# Patient Record
Sex: Female | Born: 1960 | Race: White | Hispanic: No | Marital: Married | Smoking: Never smoker
Health system: Southern US, Community
[De-identification: ages and names within clinical notes are randomized; demographics above are authoritative.]

---

## 1998-03-14 ENCOUNTER — Inpatient Hospital Stay (HOSPITAL_COMMUNITY): Admission: AD | Admit: 1998-03-14 | Discharge: 1998-03-16 | Payer: Self-pay | Admitting: Obstetrics and Gynecology

## 1998-03-16 ENCOUNTER — Encounter (HOSPITAL_COMMUNITY): Admission: RE | Admit: 1998-03-16 | Discharge: 1998-06-14 | Payer: Self-pay | Admitting: Obstetrics and Gynecology

## 1998-06-25 ENCOUNTER — Encounter (HOSPITAL_COMMUNITY): Admission: RE | Admit: 1998-06-25 | Discharge: 1998-09-04 | Payer: Self-pay | Admitting: *Deleted

## 2000-07-19 ENCOUNTER — Other Ambulatory Visit: Admission: RE | Admit: 2000-07-19 | Discharge: 2000-07-19 | Payer: Self-pay | Admitting: Obstetrics and Gynecology

## 2001-08-13 ENCOUNTER — Other Ambulatory Visit: Admission: RE | Admit: 2001-08-13 | Discharge: 2001-08-13 | Payer: Self-pay | Admitting: Obstetrics and Gynecology

## 2002-10-21 ENCOUNTER — Other Ambulatory Visit: Admission: RE | Admit: 2002-10-21 | Discharge: 2002-10-21 | Payer: Self-pay | Admitting: Obstetrics and Gynecology

## 2004-01-06 ENCOUNTER — Other Ambulatory Visit: Admission: RE | Admit: 2004-01-06 | Discharge: 2004-01-06 | Payer: Self-pay | Admitting: Obstetrics and Gynecology

## 2005-02-09 ENCOUNTER — Other Ambulatory Visit: Admission: RE | Admit: 2005-02-09 | Discharge: 2005-02-09 | Payer: Self-pay | Admitting: Obstetrics and Gynecology

## 2005-03-30 ENCOUNTER — Encounter: Admission: RE | Admit: 2005-03-30 | Discharge: 2005-03-30 | Payer: Self-pay | Admitting: Obstetrics and Gynecology

## 2006-03-31 ENCOUNTER — Encounter: Admission: RE | Admit: 2006-03-31 | Discharge: 2006-03-31 | Payer: Self-pay | Admitting: Obstetrics and Gynecology

## 2007-05-10 ENCOUNTER — Encounter: Admission: RE | Admit: 2007-05-10 | Discharge: 2007-05-10 | Payer: Self-pay | Admitting: Obstetrics & Gynecology

## 2007-10-10 ENCOUNTER — Encounter: Admission: RE | Admit: 2007-10-10 | Discharge: 2007-10-10 | Payer: Self-pay | Admitting: Orthopedic Surgery

## 2009-01-06 ENCOUNTER — Ambulatory Visit: Payer: Self-pay | Admitting: Pulmonary Disease

## 2009-01-06 DIAGNOSIS — R5383 Other fatigue: Secondary | ICD-10-CM

## 2009-01-06 DIAGNOSIS — R5381 Other malaise: Secondary | ICD-10-CM

## 2009-01-12 ENCOUNTER — Telehealth (INDEPENDENT_AMBULATORY_CARE_PROVIDER_SITE_OTHER): Payer: Self-pay | Admitting: *Deleted

## 2009-09-03 ENCOUNTER — Encounter: Admission: RE | Admit: 2009-09-03 | Discharge: 2009-09-03 | Payer: Self-pay | Admitting: Obstetrics and Gynecology

## 2010-07-23 IMAGING — US UNKNOWN US STUDY
1 series · 6 of 6 positions shown · non-contrast
Comparison: 07/01/2008, 05/10/2007 and 03/31/2006

CLINICAL DATA: Patient presents for additional views of the left
breast as a follow up to recent screening mammogram from 08/31/2009
demonstrating possible mass in the upper outer left breast.

DIGITAL DIAGNOSTIC  LEFT  MAMMOGRAM   AND LEFT BREAST ULTRASOUND:

[Series 1: unknown us study · 6 of 6 slices shown]
[im 1/6]
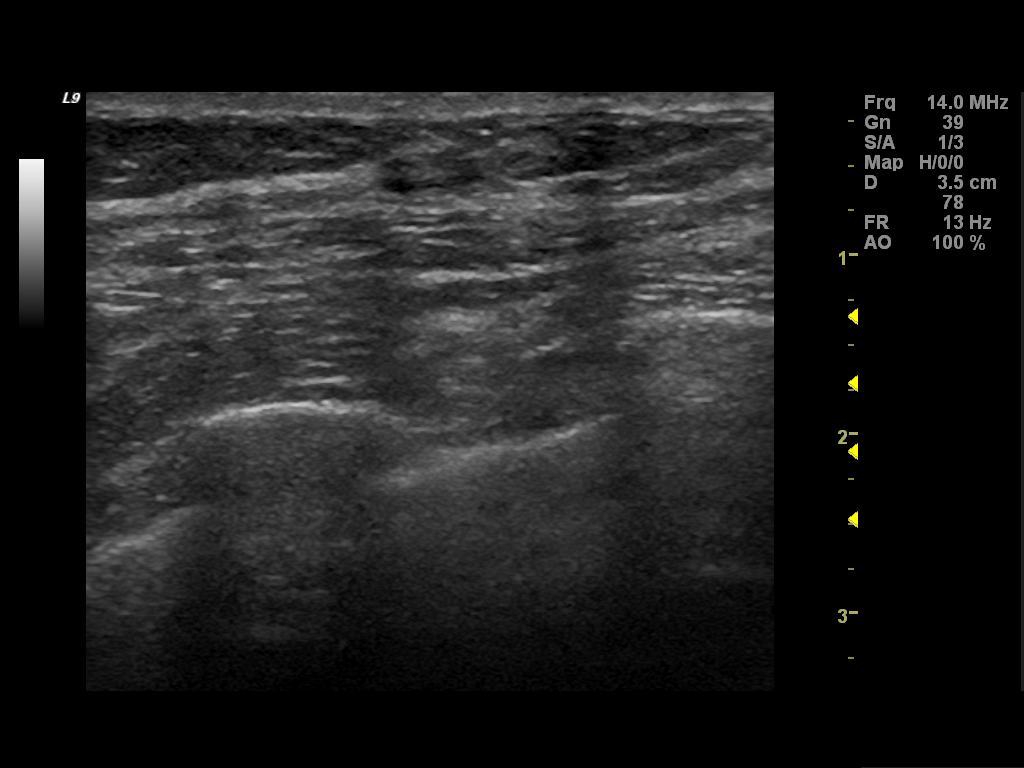
[im 2/6]
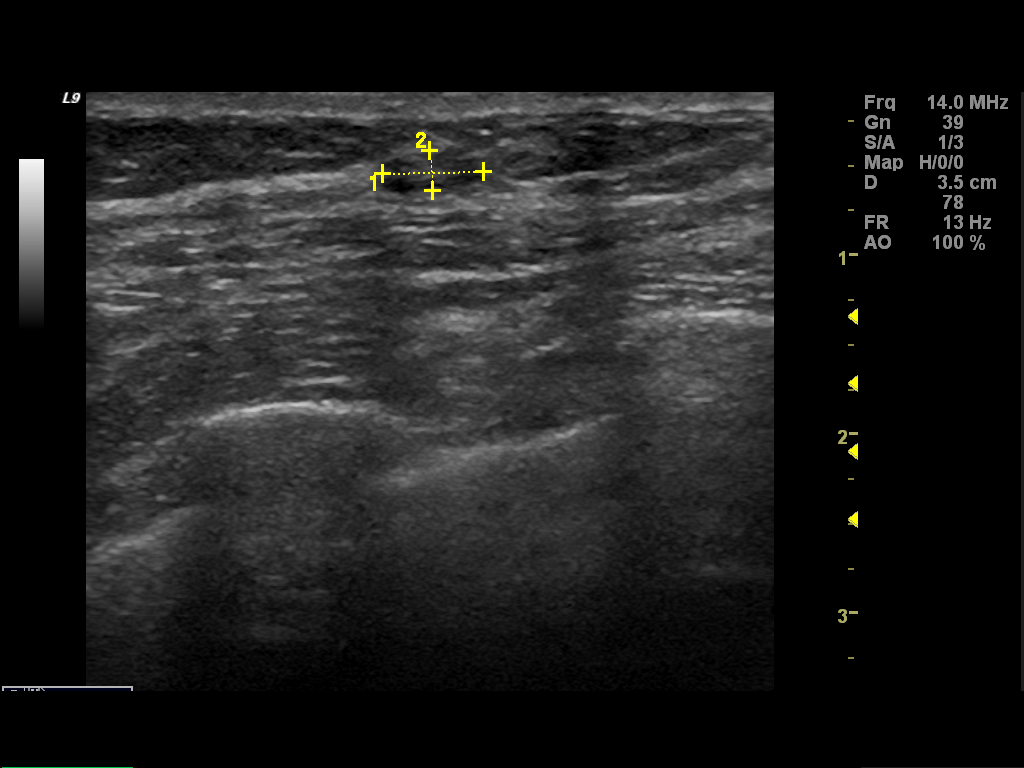
[im 3/6]
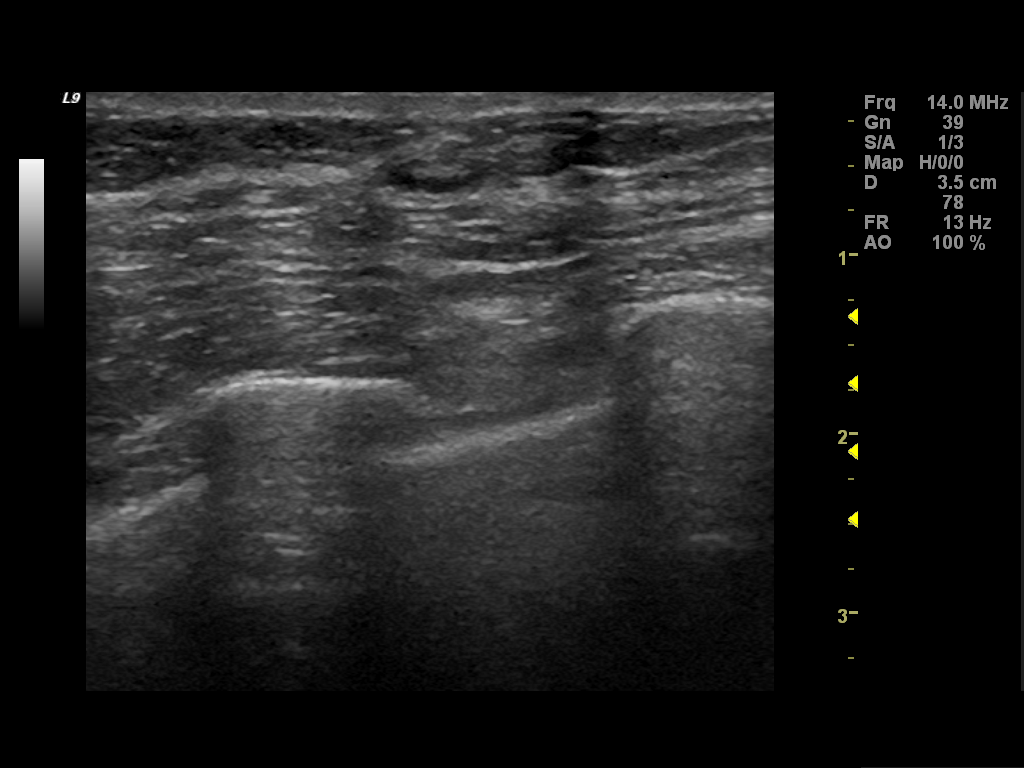
[im 4/6]
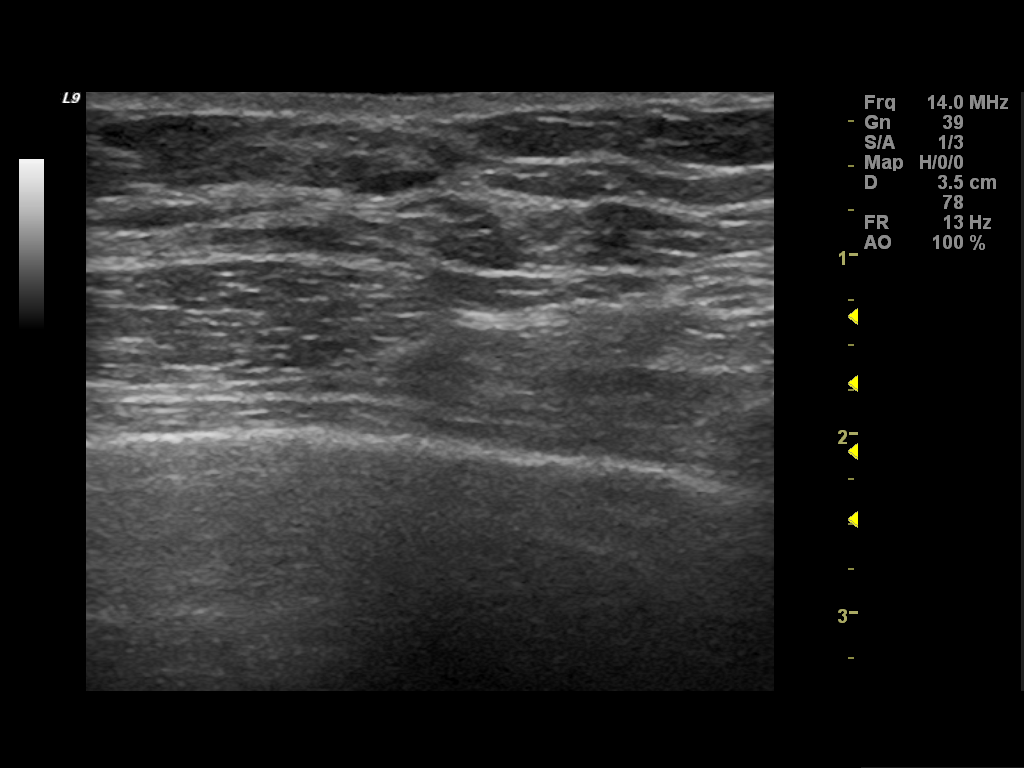
[im 5/6]
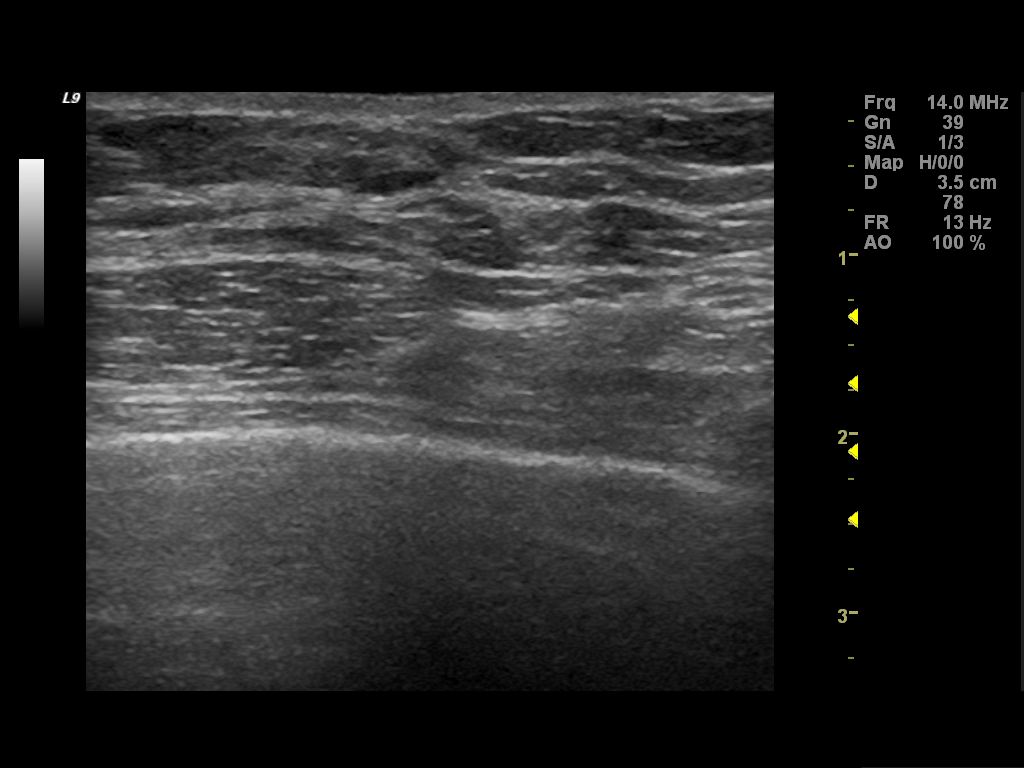
[im 6/6]
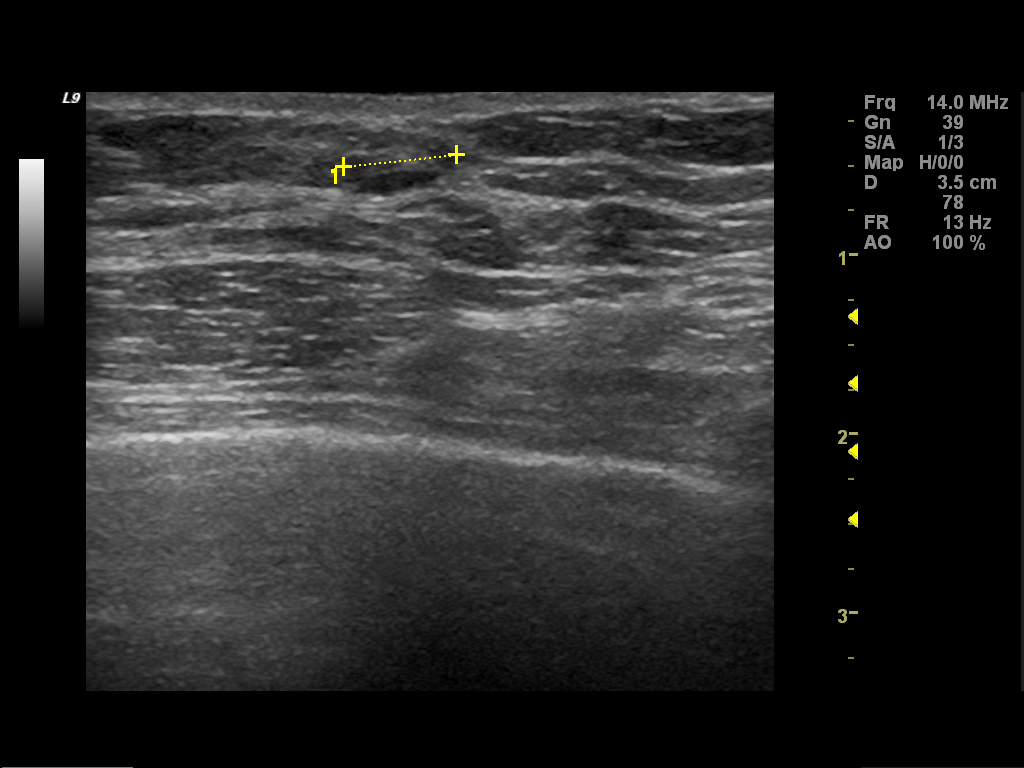

[6 of 6 positions shown; findings below may reference images not displayed]

FINDINGS: Spot compression images demonstrate no definite focal
abnormality in the upper outer left breast.  Dense fibroglandular
tissue is present in this area and appears slightly different in
morphology compared to the prior exams.

Ultrasound is performed, showing a 6 mm superficial lymph node in
the upper outer left breast in the 2 o'clock position 12 cm from
the nipple.  This appears too far from the nipple to correspond to
the mammographic density.  No other focal abnormality seen in the
upper outer left breast.
IMPRESSION: No definite focal abnormality in the upper outer left breast to
correspond to the density on the screening exam.  This likely
represents overlapping fibroglandular tissue.

Recommendations:  Due to the appearance on the screening exam,
would recommend a 6-month follow-up left breast mammogram to
document stability of this probable benign density.

BI-RADS CATEGORY 3:  Probably benign finding(s) - short interval
follow-up suggested.

## 2010-10-17 ENCOUNTER — Encounter: Payer: Self-pay | Admitting: Obstetrics and Gynecology

## 2020-06-26 ENCOUNTER — Ambulatory Visit (HOSPITAL_COMMUNITY)
Admission: EM | Admit: 2020-06-26 | Discharge: 2020-06-26 | Disposition: A | Discharge status: Home / Self Care | Payer: 59 | Attending: Family | Admitting: Family

## 2020-06-26 ENCOUNTER — Other Ambulatory Visit: Payer: Self-pay

## 2020-06-26 DIAGNOSIS — F331 Major depressive disorder, recurrent, moderate: Secondary | ICD-10-CM | POA: Diagnosis not present

## 2020-06-26 NOTE — ED Provider Notes (Signed)
Behavioral Health Urgent Care Medical Screening Exam  Patient Name: Linda Willis MRN: [id number] Date of Evaluation: 06/26/20 Chief Complaint:   Diagnosis:  Final diagnoses:  Moderate episode of recurrent major depressive disorder (HCC)    History of Present illness: Linda Willis is a 59 y.o. female.  Patient has voluntarily to [facility].  Patient states "I have been feeling low for several months."  Patient reports she was prescribed sertraline for approximately 10 years prior to changing her antidepressant medication to [facility] in January 2021.  Patient reports she is prescribed Vraylar by primary care provider.  Patient reports most recent stressors include death of her father in 2019/10/16 and her young adult son's recent diagnosis of anxiety disorder. Patient reports she presents today as she would like to initiate outpatient psychiatric follow-up.  Patient assessed by nurse practitioner.  Patient alert and oriented, answers appropriately.  Patient pleasant cooperative with assessment.  Patient denies suicidal ideations.  Patient denies any history of self-harm behaviors, denies history of suicide attempts.  Patient also denies homicidal ideations.  Patient denies both auditory and visual hallucinations.  There is no evidence of delusional thought content and patient does not appear to be responding to internal stimuli.  Patient resides in [place] with her husband.  Patient denies access to weapons.  Patient is employed part-time in [employer].  Patient denies alcohol and substance use.  Patient enjoys long distance bicycle riding but recently has been unable to ride her bicycle as she has experienced low energy since January 2021.  Patient reports increased fatigue.  Patient offered support encouragement.  Patient gives verbal consent to speak with her husband, Bonny Vanleeuwen who is in the lobby. Spoke with patient's husband  Jenny Reichmann who denies any concerns for patient safety.  Patient's husband also denies any access to weapons to his knowledge.  Patient's husband reports patient has experienced depressed mood since the death of her father.  Patient's husband supports plan to follow-up with outpatient psychiatric provider.    Psychiatric Specialty Exam  Presentation  General Appearance:Appropriate for Environment;Casual  Eye Contact:Good  Speech:Clear and Coherent;Normal Rate  Speech Volume:Normal  Handedness:Right   Mood and Affect  Mood:Depressed  Affect:Depressed;Tearful   Thought Process  Thought Processes:Coherent;Goal Directed  Descriptions of Associations:Intact  Orientation:Full (Time, Place and Person)  Thought Content:Logical;WDL  Hallucinations:None  Ideas of Reference:None  Suicidal Thoughts:No  Homicidal Thoughts:No   Sensorium  Memory:Immediate Good;Recent Good;Remote Good  Judgment:Good  Insight:Good   Executive Functions  Concentration:Good  Attention Span:Good  Recall:Good  Fund of Knowledge:Good  Language:Good   Psychomotor Activity  Psychomotor Activity:Normal   Assets  Assets:Communication Skills;Desire for Improvement;Financial Resources/Insurance;Housing;Intimacy;Leisure Time;Physical Health;Resilience;Social Support   Sleep  Sleep:Fair  Number of hours: No data recorded  Physical Exam: Physical Exam Vitals and nursing note reviewed.  Constitutional:      Appearance: She is well-developed.  HENT:     Head: Normocephalic.  Cardiovascular:     Rate and Rhythm: Normal rate.  Pulmonary:     Effort: Pulmonary effort is normal.  Neurological:     Mental Status: She is alert and oriented to person, place, and time.  Psychiatric:        Attention and Perception: Attention and perception normal.        Mood and Affect: Mood is depressed. Affect is tearful.        Speech: Speech normal.        Behavior: Behavior normal. Behavior is  cooperative.  Thought Content: Thought content normal.        Cognition and Memory: Cognition and memory normal.        Judgment: Judgment normal.    Review of Systems  Constitutional: Negative.   HENT: Negative.   Eyes: Negative.   Respiratory: Negative.   Cardiovascular: Negative.   Gastrointestinal: Negative.   Genitourinary: Negative.   Musculoskeletal: Negative.   Skin: Negative.   Neurological: Negative.   Endo/Heme/Allergies: Negative.   Psychiatric/Behavioral: Positive for depression.   Blood pressure 118/85, pulse (!) 59, temperature 97.7 F (36.5 C), temperature source Temporal, resp. rate 18, height 5\' 4"  (1.626 m), weight 130 lb (59 kg), SpO2 100 %. Body mass index is 22.31 kg/m.  Musculoskeletal: Strength & Muscle Tone: within normal limits Gait & Station: normal Patient leans: N/A   [place] MSE Discharge Disposition for Follow up and Recommendations: Based on my evaluation the patient does not appear to have an emergency medical condition and can be discharged with resources and follow up care in outpatient services for Medication Management and Individual Therapy  Follow-up with outpatient psychiatric provider.  Patient reviewed with Dr. Hampton Abbot.    Emmaline Kluver, FNP 06/26/2020, 1:04 PM

## 2020-06-26 NOTE — ED Notes (Signed)
Patient does not have a locker.

## 2020-06-26 NOTE — Progress Notes (Signed)
Linda Willis received her AVS, questions answered and she was escorted to the lobby where her husband was waiting. She did not have any personal belongings to retrieve.

## 2020-06-26 NOTE — BH Assessment (Signed)
Comprehensive Clinical Assessment (CCA) Screening, Triage and Referral Note  06/26/2020 Linda Willis [id number]  Patient is a 59 y.o. female with a history of depression who presents to [facility] for assessment. Patient reports she has been prescribed zoloft for the past 10 years and was changed to vraylar 10 months ago.  Her PCP made the change, as vraylar may be a better option for patients with fibromyalgia.   Patient is concerned vraylar isn't effective at this point. She also reports multiple stressors to include losing her father in December of 2020 and supporting her son with mental health concerns this summer.  She states her depression has worsened over the past few months.  She has had poor appetite, tearfulness, lack of motivation and lack of interest in activities she has enjoyed for years, specifically bike riding with friends. She is now struggling to function and is hoping to engage in treatment soon to prevent further decline. Patient denies SI, HI and AVH.  She is agreeable with the recommendation for outpatient treatment.    Disposition: Per Letitia Libra, NP patient does not meet inpatient criteria. The recommendation is that patient follow up with outpatient psychiatry and therapy.  Referral information is included in the AVS to be provided to pt upon discharge.   Visit Diagnosis:    ICD-10-CM   1. Moderate episode of recurrent major depressive disorder ([place])  F33.1     Patient Reported Information How did you hear about [place]? Family/Friend   Referral name: Patient presents with her husand for assessment.   Referral phone number: No data recorded Whom do you see for routine medical problems? Other (Comment) (Dr. Darron Doom - Family Medicine)   Practice/Facility Name: No data recorded  Practice/Facility Phone Number: No data recorded  Name of Contact: No data recorded  Contact Number: No data recorded  Contact Fax Number: No data recorded  Prescriber  Name: No data recorded  Prescriber Address (if known): No data recorded What Is the Reason for Your Visit/Call Today? No data recorded How Long Has This Been Causing You Problems? 1-6 months  Have You Recently Been in Any Inpatient Treatment (Hospital/Detox/Crisis Center/28-Day Program)? No   Name/Location of Program/Hospital:No data recorded  How Long Were You There? No data recorded  When Were You Discharged? No data recorded Have You Ever Received Services From [facility] Before? No   Who Do You See at [facility]? No data recorded Have You Recently Had Any Thoughts About Hurting Yourself? No   Are You Planning to Commit Suicide/Harm Yourself At This time?  No  Have you Recently Had Thoughts About [place]? No   Explanation: No data recorded Have You Used Any Alcohol or Drugs in the Past 24 Hours? No   How Long Ago Did You Use Drugs or Alcohol?  No data recorded  What Did You Use and How Much? No data recorded What Do You Feel Would Help You the Most Today? Medication;Therapy  Do You Currently Have a Therapist/Psychiatrist? No   Name of Therapist/Psychiatrist: No data recorded  Have You Been Recently Discharged From Any Office Practice or Programs? No   Explanation of Discharge From Practice/Program:  No data recorded    CCA Screening Triage Referral Assessment Type of Contact: Face-to-Face   Is this Initial or Reassessment? No data recorded  Date Telepsych consult ordered in [facility]:  No data recorded  Time Telepsych consult ordered in [facility]:  No data recorded Patient Reported Information Reviewed? Yes  Patient Left Without Being Seen? No data recorded  Reason for Not Completing Assessment: No data recorded Collateral Involvement: Patient's husband provided collateral to NP Hall Busing  Does Patient Have a [place]? No data recorded  Name and Contact of Legal Guardian:  No data recorded If Minor and Not Living with Parent(s), Who has  Custody? No data recorded Is CPS involved or ever been involved? Never  Is APS involved or ever been involved? Never  Patient Determined To Be At Risk for Harm To Self or Others Based on Review of Patient Reported Information or Presenting Complaint? No   Method: No data recorded  Availability of Means: No data recorded  Intent: No data recorded  Notification Required: No data recorded  Additional Information for Danger to Others Potential:  No data recorded  Additional Comments for Danger to Others Potential:  No data recorded  Are There Guns or Other Weapons in Your Home?  No data recorded   Types of Guns/Weapons: No data recorded   Are These Weapons Safely Secured?                              No data recorded   Who Could Verify You Are Able To Have These Secured:    No data recorded Do You Have any Outstanding Charges, Pending Court Dates, Parole/Probation? No data recorded Contacted To Inform of Risk of Harm To Self or Others: No data recorded Location of Assessment: [facility] Assessment Services  Does Patient Present under Involuntary Commitment? No   IVC Papers Initial File Date: No data recorded  [place] of Residence: [place]  Patient Currently Receiving the Following Services: Not Receiving Services   Determination of Need: Routine (7 days)   Options For Referral: Outpatient Therapy;Medication Management   Fransico Meadow, [facility]

## 2020-06-26 NOTE — Discharge Instructions (Signed)
Take all of your medications as prescribed by your mental healthcare provider.  Report any adverse effects and reactions from your medications to your outpatient provider promptly.  Do not engage in alcohol and or illegal drug use while on prescription medicines. Keep all scheduled appointments. This is to ensure that you are getting refills on time and to avoid any interruption in your medication.  If you are unable to keep an appointment call to reschedule.  Be sure to follow up with resources and follow ups given. In the event of worsening symptoms call the crisis hotline, 911, and or go to the nearest emergency department for appropriate evaluation and treatment of symptoms. Follow-up with your primary care provider for your medical issues, concerns and or health care needs.   You are encouraged to pursue outpatient psychiatry and therapy.  The following are clinics and providers offering in-person and virtual appointments:  [facility] outpatient clinic [place]. [place], [place]  [place]  - may accept [facility] insurance? [place], [place]  [phone]  Additional options - offering virtual and in-person appointments Note: These practices have added new therapists/providers and may be able to see you sooner.  [facility] [place], [place] [address]  [phone]  [place], [place] [address]  [phone]

## 2020-07-02 ENCOUNTER — Ambulatory Visit (INDEPENDENT_AMBULATORY_CARE_PROVIDER_SITE_OTHER): Payer: 59 | Admitting: Licensed Clinical Social Worker

## 2020-07-02 ENCOUNTER — Other Ambulatory Visit: Payer: Self-pay

## 2020-07-02 DIAGNOSIS — F331 Major depressive disorder, recurrent, moderate: Secondary | ICD-10-CM | POA: Diagnosis not present

## 2020-07-02 DIAGNOSIS — F909 Attention-deficit hyperactivity disorder, unspecified type: Secondary | ICD-10-CM | POA: Diagnosis not present

## 2020-07-02 NOTE — Progress Notes (Signed)
Comprehensive Clinical Assessment (CCA) Note  07/02/2020 KEREN ALVERIO [id number]  Visit Diagnosis:      ICD-10-CM   1. Major depressive disorder, recurrent episode, moderate with anxious distress ([place])  F33.1   2. Attention deficit hyperactivity disorder (ADHD), unspecified ADHD type  F90.9      CCA Part One   Part One has been completed on paper by the patient.  (See scanned document in Chart Review)   CCA Biopsychosocial  Intake/Chief Complaint:  CCA Intake With Chief Complaint CCA Part Two Date: 07/02/20 CCA Part Two Time: 76 Chief Complaint/Presenting Problem: "I'm feeling a little down, anxious. My dad died in 04-Oct-2023, but in the last month its been worse and I'm not sure what triggered it". Patient's Currently Reported Symptoms/Problems: Kristena reported that she has experienced depressive episodes over her lifetime which typically resolved on their own in 1-2 weeks, but in past year since pandemic began and she lost her father, it has been difficult to 'bounce back', leading to ongoing symptoms such as decreased energy/motivation, trouble concentrating, sleep disruption, fatigue, decreased appetite, tearfulness.  She also reported increased anxiety in past month leading to similar problems with concentration, sleep, worrying, restlessness, tension, and fatigue.  Jacquilyn reported that friends/family have suspected her of having ADHD before, and she endorsed several combined symptoms that have been problematic for years now such as avoiding activities that require focus, failing to pay attention and making mistakes, forgetfulness, restlessness, fidgeting, and talking excessively.   Raiya denied any hx of mania and reported that she is familiar with symptoms of bipolar disorder, and did not endorse cyclical moods. Individual's Strengths: Reported that she has a supportive family, stable housing, and job.  Was attending church prior to pandemic. Individual's Preferences: Reported  wanting to meet in person, once per week "To get to a more stable point quicker". Individual's Abilities: Communication skills, motivated to seek help and work on goals, physically active lifestyle, engaged with PCP and compliant with medication. Type of Services Patient Feels Are Needed: Individual therapy and medication management through psychiatrist. Initial Clinical Notes/Concerns: Alexiana Laverdure is a 59 year old married Caucasian female that presented today for in-person clinical assessment following recent admission to ED on 06/26/20 reporting that she needed to begin outpatient therapy due to "Feeling low for several months".  Tyera presented on time for appointment today and was alert, oriented x5, with no evidence or self-report of SI/HI or A/V H.  She reported that she had been receiving Sertaline for depression for 10 years from PCP and recently transitioned to [place] in January, which has provided some relief from symptoms, although she has scheduled an appointment to be assessed by a psychiatrist in November.  Minerva reported that the passing of her father in October 04, 2019 , concern for her son's mental health, and overall increase in stress related to onset of pandemic has exhausted her coping ability and led her to seek help.  She reported that she has continued to maintain work schedule, but has decreased motivation, and this has also led her to become less active, as she was previously into biking, weight training, and hiking throughout the week, but has decreased in past months.  Ashima denied any history of verbal, emotional, sexual or physical abuse or general trauma. Idora reported that she has hx of fibromyalgia.  She denied any history of SI/HI, A/V H or alcohol or drug abuse.  Mental Health Symptoms Depression:  Depression: Change in energy/activity, Difficulty Concentrating, Fatigue, Increase/decrease in appetite,  Sleep (too much or little), Tearfulness, Duration of symptoms  greater than two weeks (Reported that this episode has lasted for 1 month, stated "There have been other bouts of depression, but when I had my kids around and purpose I could push through".)  Mania:  Mania: None  Anxiety:   Anxiety: Difficulty concentrating, Fatigue, Restlessness, Worrying, Sleep, Tension (Reported that she has dealt with anxiety throughout life, "But never this bad", and current episode has persisted for 1 month now.)  Psychosis:  Psychosis: None  Trauma:  Trauma: None  Obsessions:  Obsessions: None  Compulsions:  Compulsions: None  Inattention:  Inattention: Avoids/dislikes activities that require focus, Disorganized, Does not follow instructions (not oppositional), Does not seem to listen, Fails to pay attention/makes careless mistakes, Forgetful, Poor follow-through on tasks, Symptoms present in 2 or more settings ("I think I have adult onset ADD")  Hyperactivity/Impulsivity:  Hyperactivity/Impulsivity: Blurts out answers, Difficulty waiting turn, Feeling of restlessness, Fidgets with hands/feet, Talks excessively, Several symptoms present in 2 of more settings  Oppositional/Defiant Behaviors:  Oppositional/Defiant Behaviors: None  Emotional Irregularity:  Emotional Irregularity: None  Other Mood/Personality Symptoms:      Risk Assessment- Self-Harm Potential: Risk Assessment For Self-Harm Potential Thoughts of Self-Harm: No current thoughts Method: No plan Availability of Means: No access/NA Additional Comments for Self-Harm Potential: N/A    Risk Assessment -Dangerous to Others Potential: Risk Assessment For Dangerous to Others Potential Method: No Plan Availability of Means: No access or NA Intent:  NA Notification Required: No need or identified person  Mental Status Exam Appearance and self-care  Stature:  Stature: Small (5'4, self-reported.)  Weight:  Weight: Average weight (130lbs, self-reported.)  Clothing:  Clothing: Casual  Grooming:  Grooming: Normal   Cosmetic use:  Cosmetic Use: Age appropriate  Posture/gait:  Posture/Gait: Normal  Motor activity:  Motor Activity: Not Remarkable  Sensorium  Attention:  Attention: Normal  Concentration:  Concentration: Normal  Orientation:  Orientation: X5  Recall/memory:  Recall/Memory: Normal  Affect and Mood  Affect:  Affect: Depressed  Mood:  Mood: Depressed  Relating  Eye contact:  Eye Contact: Normal  Facial expression:  Facial Expression: Depressed  Attitude toward examiner:  Attitude Toward Examiner: Cooperative  Thought and Language  Speech flow: Speech Flow: Clear and Coherent  Thought content:  Thought Content: Appropriate to Mood and Circumstances  Preoccupation:  Preoccupations: None  Hallucinations:  Hallucinations: None  Organization:     Transport planner of Knowledge:  Fund of Knowledge: Average  Intelligence:  Intelligence: Average  Abstraction:  Abstraction: Functional  Judgement:  Judgement: Good  Reality Testing:  Reality Testing: Adequate  Insight:  Insight: Fair  Decision Making:  Decision Making: Normal  Social Functioning  Social Maturity:  Social Maturity: Isolates  Social Judgement:  Social Judgement: Normal  Stress  Stressors:  Stressors: Family conflict, Grief/losses, Transitions  Coping Ability:  Coping Ability: Exhausted, English as a second language teacher Deficits:  Skill Deficits: Activities of daily living  Supports:  Supports: Family, Friends/Service system     Religion: Religion/Spirituality Are You A Religious Person?: Yes What is Your Religious Affiliation?: Catholic How Might This Affect Treatment?: "I don't think it makes a difference"  Leisure/Recreation: Leisure / Recreation Do You Have Hobbies?: Yes Leisure and Hobbies: "I like to ride my bikes, go to [employer], and teach water aerobics x1 per month".  Exercise/Diet: Exercise/Diet Do You Exercise?: Yes What Type of Exercise Do You Do?: Bike, Swimming, Weight Training, Run/Walk, Hiking How  Many Times a Week  Do You Exercise?: 4-5 times a week Have You Gained or Lost A Significant Amount of Weight in the Past Six Months?: No Do You Follow a Special Diet?: No Do You Have Any Trouble Sleeping?: Yes Explanation of Sleeping Difficulties: "Once I get to sleep I can sleep 10 hours.  Its just a matter of falling asleep because my mind is racing".   CCA Employment/Education  Employment/Work Situation: Employment / Work Situation Employment situation: Employed Where is patient currently employed?: [employer] long has patient been employed?: 10 years Patient's job has been impacted by current illness: Yes Describe how patient's job has been impacted: "I just haven't been as excited to go to work as I was". What is the longest time patient has a held a job?: 10 years Where was the patient employed at that time?: Current job. 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  I was shy") Were Any Medications Ever Prescribed For These Difficulties?: No Patient's Education Has Been Impacted by Current Illness: No   CCA Family/Childhood History  Family and Relationship History: Family history Marital status: Married Number of Years Married: 27 What types of issues is patient dealing with in the relationship?: Denied.  Supportive partner who encouraged her to get help. Are you sexually active?: Yes What is your sexual orientation?: Heterosexual Has your sexual activity been affected by drugs, alcohol, medication, or emotional stress?: Some medical issues interfering. Does patient have  children?: Yes How many children?: 3 How is patient's relationship with their children?: 2 daughters and 1 son, 82, 83, 22.  "we're very close"  Childhood History:  Childhood History By whom was/is the patient raised?: Both parents Additional childhood history information: "I think it was pretty awesome.  I have a lot of good memories and stories" Description of patient's relationship with caregiver when they were a child: "It was good.  We were really close" Patient's description of current relationship with people who raised him/her: Father passed away in 09/19/23, mother is 64 and still alive.  Grieving father's passing, still very close with mother. How were you disciplined when you got in trouble as a child/adolescent?: "Couple of times I was spanked.  Other than that nothing major, maybe my keys taken away or grounded". Does patient have siblings?: Yes Number of Siblings: 2 Description of patient's current relationship with siblings: 2 older brothers; "Things are so so.  Me and my older brother barely talk.  I talk to the other one sporadically". Did patient suffer any verbal/emotional/physical/sexual abuse as a child?: No Did patient suffer from severe childhood neglect?: No Has patient ever been sexually abused/assaulted/raped as an adolescent or adult?: No Was the patient ever a victim of a crime or a disaster?: No Witnessed domestic violence?: No Has patient been affected by domestic violence as an adult?: No  CCA Substance Use  Alcohol/Drug Use: Alcohol / Drug Use Pain Medications: Denied. Prescriptions: Vraylar, Lamotrigine Over the Counter: Denied. History of alcohol / drug use?: No history of alcohol / drug abuse  Recommendations for Services/Supports/Treatments: Recommendations for Services/Supports/Treatments Recommendations For Services/Supports/Treatments: Individual Therapy, Medication Management  DSM5 Diagnoses: Patient Active Problem  List   Diagnosis Date  Noted  . FATIGUE 01/06/2009    Patient Centered Plan: Meet with clinician for in person therapy sessions x1 every 2 weeks to address progress towards goals, as well as barriers to success;  Attend initial appointment with psychiatrist within next 1-2 months to address efficacy of behavioral medication and make changes to dose/regimen as needed; Take medications as prescribed each day to aid in symptom reduction and improvement in daily functioning; Reduce depression severity from average of 8/10 down to 5/10 in next 90 days by setting aside 1 hour of positive self-care activities daily; Reduce average anxiety from 5/10 in severity down to 3/10 via utilization of 3-4 relaxation and/or grounding skills daily when related symptoms arise via techniques such as mindful breathing, progressive muscle relaxation, 5-4-3-2-1 grounding, etc; Use therapy sessions as needed, in addition to contemplating benefits of engagement in related support groups to assist in healthy processing of unresolved grief/loss related to deceased father in 10/01/19; Set aside at least 1-2 hours per week towards engaging with peers to increase socialization, reduce compartmentalization of feelings, and avoid isolation;  Commit to riding bike x3 per week, teaching water aerobics x1 per month, taking walks x2-3 per weeks, and going on hikes with husband weekly in order to improve both physical and mental health as well as maintain healthy body weight; Implement 3-4 sleep hygiene techniques in next 3 months in order to fall asleep more easily, and maintain a more healthy routine and avoid any related fatigue/irritability; Speak with husband about reengaging with church virtually once per week to increase connection with spiritual community and higher power within next 90 days.     Referrals to Alternative Service(s): Referred to Alternative Service(s):   Place:   Date:   Time:    Referred to Alternative Service(s):   Place:   Date:    Time:    Referred to Alternative Service(s):   Place:   Date:   Time:    Referred to Alternative Service(s):   Place:   Date:   Time:     Granville Lewis, Deon Pilling 07/02/20

## 2020-07-08 ENCOUNTER — Other Ambulatory Visit: Payer: Self-pay

## 2020-07-08 ENCOUNTER — Ambulatory Visit (INDEPENDENT_AMBULATORY_CARE_PROVIDER_SITE_OTHER): Payer: 59 | Admitting: Licensed Clinical Social Worker

## 2020-07-08 DIAGNOSIS — F909 Attention-deficit hyperactivity disorder, unspecified type: Secondary | ICD-10-CM | POA: Diagnosis not present

## 2020-07-08 DIAGNOSIS — F331 Major depressive disorder, recurrent, moderate: Secondary | ICD-10-CM | POA: Diagnosis not present

## 2020-07-08 NOTE — Progress Notes (Signed)
THERAPIST PROGRESS NOTE   Session Time: 3:00pm - 4:00pm   Location: Patient: [facility] [place] [facility]  Provider: [facility] [place] [facility]    Participation Level: Active    Behavioral Response: Alert, casually dressed, anxious mood/affect   Type of Therapy:  Individual Therapy   Treatment Goals addressed: Medication compliance; Anxiety and depression management; Exercise    Interventions: CBT, mindful breathing meditation   Summary: Linda Willis is a 59 year old married Caucasian female that presented for in person therapy appointment today with diagnoses of Major Depressive Disorder, recurrent, moderate with anxious distress; and ADHD unspecified type.      Suicidal/Homicidal: None; without plan or intent.    Therapist Response: Clinician met with Linda Willis for in person therapy session and assessed for safety, sobriety and medication compliance.  Linda Willis presented for this appointment on time and was alert, oriented x5, with no evidence or self-report of SI/HI or A/V H.  Linda Willis reported that she continues taking medication as prescribed and noted that she has also started prescription for Wellbutrin.  She denied abuse of illicit substances or alcohol.  Clinician inquired about Linda Willis's emotional ratings today, as well as any significant changes in thoughts, feelings, or behavior since completion of initial assessment.  Linda Willis reported scores of 5/10 for both depression and anxiety today.  She reported that she has not experienced any panic attacks recently, and has begun riding her bike again with friends, although there is still some fear/anxiety related to cars getting too close to her group while on the road.  Clinician praised Linda Willis for progress on exercise goal and inquired about how she attempts to cope when triggered by cars driving closely.  Linda Willis reported that she takes a deep breath and tries to engage in self-talk, but sometimes she still gets overwhelmed with anxious thoughts.  Clinician  explained CBT model to [place] today, including link between thoughts, feelings, and behavior and ability to influence change in behavior by modifying patterns of thinking.  Clinician invited Linda Willis to practice mindful breathing meditation to assist in staying grounded during distressing events, and guided her through process of getting comfortable, achieving relaxing breathing rhythm, and then focusing upon maintaining this for 10 minutes while allowing distressing thoughts and feelings to be acknowledged, but then let go of. Clinician also encouraged Linda Willis to continue riding bike regularly to stay active and work towards desensitizing herself to related triggers through effective use of developing coping skills.  Interventions were effective, as evidenced by Linda Willis successfully engaging mindful meditation in session today and noting that although she was unable to clear her mind, she intends to add this to daily routine and believes it could be helpful for addressing anxiety and depression with regular practice.  Clinician will continue to monitor.                                      Plan: Follow up again in 1 week.   Diagnoses: Major Depressive Disorder, recurrent, moderate with anxious distress; and ADHD unspecified type   Shade Flood, LCSW, LCAS 07/08/20

## 2020-07-15 ENCOUNTER — Other Ambulatory Visit: Payer: Self-pay

## 2020-07-15 ENCOUNTER — Ambulatory Visit (INDEPENDENT_AMBULATORY_CARE_PROVIDER_SITE_OTHER): Payer: 59 | Admitting: Licensed Clinical Social Worker

## 2020-07-15 DIAGNOSIS — F331 Major depressive disorder, recurrent, moderate: Secondary | ICD-10-CM

## 2020-07-15 DIAGNOSIS — F909 Attention-deficit hyperactivity disorder, unspecified type: Secondary | ICD-10-CM | POA: Diagnosis not present

## 2020-07-15 NOTE — Progress Notes (Signed)
THERAPIST PROGRESS NOTE   Session Time: 3:00pm - 4:00pm   Location: Patient: [facility] [place] [facility]  Provider: [facility] [facility]    Participation Level: Active    Behavioral Response: Alert, casually dressed, anxious mood/affect   Type of Therapy:  Individual Therapy   Treatment Goals addressed: Medication compliance; Anxiety and depression management; Exercise;  Socialization   Interventions: CBT, healthy boundaries   Summary: Linda Willis is a 59 year old married Caucasian female that presented for in person therapy appointment today with diagnoses of Major Depressive Disorder, recurrent, moderate with anxious distress; and ADHD unspecified type.      Suicidal/Homicidal: None; without plan or intent.    Therapist Response: Clinician met with Linda Willis for in person therapy appointment and assessed for safety, sobriety and medication compliance.  Linda Willis presented for today's session on time and was alert, oriented x5, with no evidence or self-report of SI/HI or A/V H.  Linda Willis reported ongoing compliance with medication and denied any use of illicit substances or alcohol.  Clinician inquired about Linda Willis's current emotional ratings, as well as any significant changes in thoughts, feelings, or behavior since last session.  Linda Willis reported scores of 2/10 for depression and 5/10 for anxiety today, stating "Today has been a pretty good day.  I went for a bike ride and swam too".  Linda Willis reported that she has been practicing mindful breathing at night before bed, but has struggled with "My thoughts going crazy".  Clinician inquired about what issues she tends to find herself ruminating upon before bed.  Linda Willis reported that family stress has been an ongoing issue, as her mother is in ill health and regularly tells her that when she eventually passes away, Linda Willis will be expected to keep the siblings close.  Linda Willis reported that although one brother is in touch with her at least once a month and is  relatively open, the other is closed off and not receptive to her attempts to connect.  Clinician discussed topic of healthy boundaries with Linda Willis today, including various categories (porous, rigid, and normal) and their specific characteristics.  Clinician inquired about which boundary types correlated to each family member, and explored strategies for improving connections if possible.  Linda Willis acknowledged that she has porous boundaries with her youngest brother and rigid boundaries with her eldest, who is largely shut off from the other siblings, leaving her feeling hopeless about the directive her mother has placed upon her since both brothers are also rigid towards one another.  Clinician suggested Linda Willis speak with her mother about issue regarding unreasonable expectation to resolve noted communication issues with siblings and focus upon self-care in meantime to enhance her own wellbeing.  Linda Willis reported that she would, and already has a full schedule this week involving more bike rides, a birthday party, book club meeting, and Linda Willis as well.  Clinician encouraged Linda Willis utilize these events to increase socialization and support with peers if possible.  Clinician will continue to monitor.                                      Plan: Follow up again in 1 week.   Diagnoses: Major Depressive Disorder, recurrent, moderate with anxious distress; and ADHD unspecified type   Shade Flood, LCSW, LCAS 07/15/20

## 2020-07-22 ENCOUNTER — Ambulatory Visit (HOSPITAL_COMMUNITY): Payer: 59 | Admitting: Licensed Clinical Social Worker

## 2020-07-29 ENCOUNTER — Other Ambulatory Visit: Payer: Self-pay

## 2020-07-29 ENCOUNTER — Ambulatory Visit (INDEPENDENT_AMBULATORY_CARE_PROVIDER_SITE_OTHER): Payer: 59 | Admitting: Licensed Clinical Social Worker

## 2020-07-29 DIAGNOSIS — F331 Major depressive disorder, recurrent, moderate: Secondary | ICD-10-CM | POA: Diagnosis not present

## 2020-07-29 DIAGNOSIS — F909 Attention-deficit hyperactivity disorder, unspecified type: Secondary | ICD-10-CM

## 2020-07-29 NOTE — Progress Notes (Signed)
THERAPIST PROGRESS NOTE   Session Time: 3:00pm - 4:00pm  Location: Patient: [facility] [place] [facility]  Provider: [facility] [place] [facility]    Participation Level: Active    Behavioral Response: Alert, casually dressed, depressed mood/affect   Type of Therapy:  Individual Therapy   Treatment Goals addressed: Medication compliance; Anxiety and depression management; Grief and loss   Interventions: CBT: gratitude practice, cognitive restructuring, grief and loss   Summary: Jadee Golebiewski is a 59 year old married Caucasian female that presented for in person therapy appointment today with diagnoses of Major Depressive Disorder, recurrent, moderate with anxious distress; and ADHD unspecified type.      Suicidal/Homicidal: None; without plan or intent.    Therapist Response: Clinician met with Leighana for in person therapy session and assessed for safety, sobriety and medication compliance.  Sahory presented for appointment on time and was alert, oriented x5, with no evidence or self-report of SI/HI or A/V H.  Leaner reported that she continues taking medication as prescribed and denied any use of illicit substances or alcohol.  Clinician inquired about Kamalani's emotional ratings today, as well as any significant changes in thoughts, feelings, or behavior since previous check-in.  Lucely reported scores of 5/10 for both depression and anxiety today, stating "I've been a little sad this week".  Clinician inquired about whether there have been any significant events or stressors present which could account for this impact upon mood.  Skyelynn reported that she has found herself dwelling upon the past often and what she could have done differently to avoid regrets.  Clinician inquired about specific events Ladelle has found herself ruminating upon, and explained process of cognitive restructuring in order to look back on past events through different lens, challenge any related negative thoughts, and find things to be  grateful for instead.  Dally was receptive to this approach and reported that she although she struggles with debating whether she should have gone back to work after losing her job following 3rd pregnancy, she was able to acknowledge today that this offered her more quality time with her children that she wouldn't trade any amount of money for.  Clinician also encouraged Jaasia to continue with meditation practice to stay more focused on here and now versus past, and suggested taking up gratitude journaling in order to focus more on positives each day to further improve outlook, offering example prompts such as "What is a simple pleasure you're grateful for today?" and "What is something you are grateful for today that you didn't have 1 year ago?"  The passing of Khaleah's father one year ago came up in discussion during this time, and she wept recalling how he passed in December, and date is nearing for anniversary.  Clinician allowed for time in session to recall positive memories Saraiah had with her father, and discussed traditions that could be implemented in anniversary month to celebrate his life to facilitate healthy grieving process.  Interventions were effective, as evidenced by Helene Kelp reporting that today's session was helpful for redirecting her thinking towards more positive outlook, reinforcing healthy emotional expression, and preparing for difficult month ahead when she is likely to struggle with grief.  Clinician provided referral for [facility] grief and loss group and encouraged Jaki to consider engaging in this for support with relatable peers.  Clinician will continue to monitor.  Plan: Follow up again in 1 week.   Diagnoses: Major Depressive Disorder, recurrent, moderate with anxious distress; and ADHD unspecified type   Shade Flood, LCSW, LCAS 07/29/20

## 2020-08-05 ENCOUNTER — Ambulatory Visit (HOSPITAL_COMMUNITY): Payer: 59 | Admitting: Licensed Clinical Social Worker

## 2020-08-13 ENCOUNTER — Other Ambulatory Visit: Payer: Self-pay

## 2020-08-13 ENCOUNTER — Ambulatory Visit (INDEPENDENT_AMBULATORY_CARE_PROVIDER_SITE_OTHER): Payer: 59 | Admitting: Licensed Clinical Social Worker

## 2020-08-13 DIAGNOSIS — F909 Attention-deficit hyperactivity disorder, unspecified type: Secondary | ICD-10-CM

## 2020-08-13 DIAGNOSIS — F331 Major depressive disorder, recurrent, moderate: Secondary | ICD-10-CM

## 2020-08-13 NOTE — Progress Notes (Signed)
THERAPIST PROGRESS NOTE   Session Time: 3:00pm - 4:00pm  Location: Patient: [facility]  Provider: [facility]    Participation Level: Active    Behavioral Response: Alert, casually dressed, depressed mood/affect   Type of Therapy:  Individual Therapy   Treatment Goals addressed: Medication compliance; Psychiatry follow-up; Anxiety and depression management; Exercise; Sleep    Interventions: CBT, strategies for managing ADHD, 5-4-3-2-1 grounding   Summary: Linda Willis is a 59 year old married Caucasian female that presented for in person therapy appointment today with diagnoses of Major Depressive Disorder, recurrent, moderate with anxious distress; and ADHD unspecified type.      Suicidal/Homicidal: None; without plan or intent.    Therapist Response: Clinician met with Linda Willis for in person therapy appointment and assessed for safety, sobriety and medication compliance.  Trust presented for today's session on time and was alert, oriented x5, with no evidence or self-report of SI/HI or A/V H.  Linda Willis reported ongoing compliance with medication and denied any use of illicit substances or alcohol.  Clinician inquired about Linda Willis's current emotional ratings, as well as any significant changes in thoughts, feelings, or behavior since last check-in.  Linda Willis reported scores of 7/10 for depression, 5/10 for anxiety, and 0/10 for anger/irritability today.  She reported that she has been sleeping roughly 8 hours per night on average, and still engaging in regular exercise, including walking 4.5 miles today before session.  Linda Willis reported that some days "Feel like a blur" and she often finds it difficult to focus on tasks, so sometimes she isn't able to get as much done as she intends, or completes tasks without feeling satisfied with results.  Clinician revisited initial assessment with Linda Willis and discussed her lifelong symptoms suggestive of ADHD diagnosis.  Clinician recommended that  Linda Willis prioritize speaking with PCP and/or psychiatrist about appropriateness to begin a medication that could help address these symptoms in order to increase focus.  Clinician also made recommendations for becoming more structured and organized, such as keeping a daily/weekly planner, and setting realistic goals to achieve each day without overwhelming herself.  Clinician also discussed benefits of practicing grounding techniques to aid in focusing attention on tasks and staying calm when stressed, and invited Linda Willis to try 5-4-3-2-1 approach, involving identifying 5 things in one's environment that can be seen, 4 that can be touched, 3 that can be heard, 2 that can be smelled, and 1 that can be tasted. Interventions were effective, as evidenced by Linda Willis engaging in grounding activity successfully and reporting that she would practice this, in addition to expressing increased interest in talking to medical professionals about ADHD medication.  Clinician will continue to monitor.                                    Plan: Follow up again in 1 week.   Diagnoses: Major Depressive Disorder, recurrent, moderate with anxious distress; and ADHD unspecified type   Cory  Bates, LCSW, LCAS 08/13/20 

## 2020-08-27 ENCOUNTER — Ambulatory Visit (HOSPITAL_COMMUNITY): Payer: 59 | Admitting: Licensed Clinical Social Worker

## 2020-09-02 ENCOUNTER — Ambulatory Visit (INDEPENDENT_AMBULATORY_CARE_PROVIDER_SITE_OTHER): Payer: 59 | Admitting: Licensed Clinical Social Worker

## 2020-09-02 ENCOUNTER — Other Ambulatory Visit: Payer: Self-pay

## 2020-09-02 DIAGNOSIS — F909 Attention-deficit hyperactivity disorder, unspecified type: Secondary | ICD-10-CM | POA: Diagnosis not present

## 2020-09-02 DIAGNOSIS — F331 Major depressive disorder, recurrent, moderate: Secondary | ICD-10-CM | POA: Diagnosis not present

## 2020-09-02 NOTE — Progress Notes (Signed)
THERAPIST PROGRESS NOTE   Session Time: 2:00pm - 3:00pm  Location: Patient: [facility] [place] [facility]  Provider: [facility]    Participation Level: Active    Behavioral Response: Alert, casually dressed, depressed mood/affect   Type of Therapy:  Individual Therapy   Treatment Goals addressed: Medication compliance; Anxiety and depression management; Exercise; Grief and loss   Interventions: CBT, communication skills, grief and loss   Summary: Linda Willis is a 59 year old married Caucasian female that presented for in person therapy appointment today with diagnoses of Major Depressive Disorder, recurrent, moderate with anxious distress; and ADHD unspecified type.      Suicidal/Homicidal: None; without plan or intent.    Therapist Response: Clinician met with Linda Willis for in person therapy session and assessed for safety, sobriety and medication compliance.  Linda Willis presented for todays appointment on time and was alert, oriented x5, with no evidence or self-report of SI/HI or A/V H.  Linda Willis reported that she continues taking medication as prescribed and denied any use of illicit substances or alcohol.  Clinician inquired about Linda Willis's emotional ratings today, as well as any significant changes in thoughts, feelings, or behavior since previous check-in.  Linda Willis reported scores of 6/10 for depression, 6/10 for anxiety, and 2/10 for anger/irritability today.  Linda Willis reported that she has been very busy lately between engaging in Thanksgiving holiday with family, maintaining work schedule, riding her bike/taking walks, and doing Christmas shopping.  Linda Willis disclosed that she was hesitant to come in today, stating "I didn't really feel like talking and its hard for me to open up".  Clinician validated these feelings and inquired about what topics Linda Willis would feel comfortable discussing with minimal anxiety.  Linda Willis reported that her job has been more stressful as of late due to management changes  which have put her in contact with someone that can be very critical and condescending at times.  Clinician explored strategies with Linda Willis for dealing effectively with difficult people in the workplace such as this, including maintaining a calm mindset, trying to understand the other individual's intentions, seeking perspective from other coworkers/supports, expressing one's own feelings on the matter assertively and clearly, as well as seeking mutual goals to increase sense of rapport and mutual understanding.  Linda Willis reported that although the situation can be frustrating at times, she believes she will wait a bit longer before she addresses these issues, stating "For now just talking it out is a little helpful".  Linda Willis then brought up the fact that the anniversary of her father's passing 1 year ago is nearing, and became tearful discussing how this has been more prominently on her mind, and she does not know how she will handle it.  Clinician validated Linda Willis's feelings regarding this challenge, and provided suggestions on how to handle grieving process of a loved one during anniversary in healthy ways, including staying close to support system during this time, taking time to reflect on positive memories shared with the individual, considering traditions that could be utilized to celebrate their life, engaging in grief and loss groups, and seeking balance between activities for distraction, and necessary time to experience emotions that arise.  Interventions were effective, as evidenced by Linda Willis reporting that it was helpful to open up about this upcoming date and her feelings on the matter, and she will call her mother and brother that day for support, use a personal day at work, and consider going on a bike ride with some friends that won't be nosy.  Clinician  will continue to monitor.                                     Plan: Follow up again in 1 week.   Diagnoses: Major Depressive Disorder, recurrent,  moderate with anxious distress; and ADHD unspecified type   Shade Flood, LCSW, LCAS 09/02/20

## 2020-09-09 ENCOUNTER — Other Ambulatory Visit: Payer: Self-pay

## 2020-09-09 ENCOUNTER — Telehealth (HOSPITAL_COMMUNITY): Payer: Self-pay | Admitting: Licensed Clinical Social Worker

## 2020-09-09 ENCOUNTER — Ambulatory Visit (HOSPITAL_COMMUNITY): Payer: 59 | Admitting: Licensed Clinical Social Worker

## 2020-09-09 NOTE — Telephone Encounter (Signed)
Linda Willis had a virtual therapy session scheduled today at 3pm.  Clinician outreached her at 3:10pm when she did not present on time.  Linda Willis did not answer this phone call, so clinician left a voicemail reminding her of appointment and included direct office number, as well as number for front desk staff in case she wished to reschedule.  Clinician ended virtual meeting at 3:15pm when Linda Willis did not appear or return call, and informed front desk of no-show event.    Shade Flood, LCSW, LCAS 09/09/20

## 2020-09-23 ENCOUNTER — Other Ambulatory Visit: Payer: Self-pay

## 2020-09-23 ENCOUNTER — Ambulatory Visit (INDEPENDENT_AMBULATORY_CARE_PROVIDER_SITE_OTHER): Payer: 59 | Admitting: Licensed Clinical Social Worker

## 2020-09-23 DIAGNOSIS — F331 Major depressive disorder, recurrent, moderate: Secondary | ICD-10-CM | POA: Diagnosis not present

## 2020-09-23 DIAGNOSIS — F909 Attention-deficit hyperactivity disorder, unspecified type: Secondary | ICD-10-CM | POA: Diagnosis not present

## 2020-09-23 NOTE — Progress Notes (Signed)
THERAPIST PROGRESS NOTE   Session Time: 3:00pm - 4:00pm  Location: Patient: [facility] [place] [facility]  Provider: [facility] [place] [facility]    Participation Level: Active    Behavioral Response: Alert, casually dressed, depressed mood/affect   Type of Therapy:  Individual Therapy   Treatment Goals addressed: Medication compliance; Anxiety and depression management; Exercise; Grief and loss; Sleep hygiene    Interventions: CBT: challenging anxious thoughts, sleep hygiene techniques   Summary: Linda Willis is a 59 year old married Caucasian female that presented for in person therapy appointment today with diagnoses of Major Depressive Disorder, recurrent, moderate with anxious distress; and ADHD unspecified type.      Suicidal/Homicidal: None; without plan or intent.    Therapist Response: Clinician met with Shakia for in person therapy appointment and assessed for safety, sobriety and medication compliance.  Virgil presented for today's session on time and was alert, oriented x5, with no evidence or self-report of SI/HI or A/V H.  Kayliegh reported ongoing compliance with medication and denied any use of illicit substances or alcohol.  Clinician inquired about Tymara's current emotional ratings, as well as any significant changes in thoughts, feelings, or behavior since last check-in.  Elvis reported scores of 7/10 for depression, 4/10 for anxiety, and 0/10 for anger/irritability today.  Helene Kelp apologized for no-showing previous appointment and reported that it was scheduled on the same day as her father's passing, so she instead spent the day riding her bike, and spending time with her friend's for dinner as outlets for healthy distraction.  Clinician acknowledged understanding and encouraged her to call ahead next time for cancellation.  Clinician inquired about Lucely's struggles and successes over past weeks.  Malon reported that she spent Christmas with family and had a good time catching up with her  children.  She reported that she has remained active, taking walks, bike rides, and swimming when weather and her schedule permits.  Anice reported one struggle has been experiencing poor quality of sleep, noting that she goes to bed late, wakes up frequently during the night, and feels fatigued during the day.  Clinician discussed sleep hygiene techniques with her that could be of benefit, including sticking to a normal sleep schedule (I.e. fixed bed/sleep time, avoiding naps), following a nightly routine that promotes rest (I.e. using 30 minutes to wind down with meditation, journaling; dimming lights and avoiding stimulation from electronics, getting up and trying again versus tossing and turning), as well as avoiding caffeine/sugar/large meals before bed, and optimizing bedroom/sleeping space to promote sleep through changes such as using a sound machine, black out curtains, etc.  Jackqueline was receptive to several of these suggestions, noting that some barriers to rest include the fact that her husband snores, there is light that comes in the room from the neighbors home, and she usually watches TV close to bedtime.  Lajoyce also reported that when she is winding down for bed, she can experience anxious thoughts about a variety of subjects, and finds it difficult to ignore them.  Clinician assisted Katrenia in challenging these anxious thoughts via practice handout that involved weighing evidence for and against a thought being rational in order to help reduce negative impact of her late night thinking patterns on mood and sleep.  Intervention was effective, as evidenced by Helene Kelp participating in activity, and reporting that although she was unable to fall asleep last night because of worry for her son's safety, this exercise showed her that there is ample evidence to suggest her worry is  not warranted, stating "I'm going to try to sort through my thoughts and see if they make sense before just letting them carry  my mind away".  Clinician will continue to monitor.                                     Plan: Follow up again in 1 week.   Diagnoses: Major Depressive Disorder, recurrent, moderate with anxious distress; and ADHD unspecified type   Shade Flood, LCSW, LCAS 09/23/20

## 2021-08-05 ENCOUNTER — Other Ambulatory Visit: Payer: Self-pay | Admitting: Urology

## 2021-08-05 DIAGNOSIS — R3121 Asymptomatic microscopic hematuria: Secondary | ICD-10-CM

## 2021-08-17 ENCOUNTER — Other Ambulatory Visit (HOSPITAL_COMMUNITY): Payer: Self-pay | Admitting: Urology

## 2021-08-17 DIAGNOSIS — R3121 Asymptomatic microscopic hematuria: Secondary | ICD-10-CM

## 2021-08-26 ENCOUNTER — Ambulatory Visit (HOSPITAL_COMMUNITY)
Admission: RE | Admit: 2021-08-26 | Discharge: 2021-08-26 | Disposition: A | Discharge status: Home / Self Care | Payer: 59 | Source: Ambulatory Visit | Attending: Urology | Admitting: Urology

## 2021-08-26 ENCOUNTER — Other Ambulatory Visit: Payer: Self-pay

## 2021-08-26 DIAGNOSIS — R3121 Asymptomatic microscopic hematuria: Secondary | ICD-10-CM | POA: Insufficient documentation

## 2021-08-26 LAB — POCT I-STAT CREATININE: Creatinine, Ser: 1.1 mg/dL — ABNORMAL HIGH (ref 0.44–1.00)

## 2021-08-26 MED ORDER — IOHEXOL 350 MG/ML SOLN
80.0000 mL | Freq: Once | INTRAVENOUS | Status: AC | PRN
  Administered 2021-08-26: 80 mL via INTRAVENOUS

## 2021-12-21 ENCOUNTER — Ambulatory Visit (INDEPENDENT_AMBULATORY_CARE_PROVIDER_SITE_OTHER): Payer: 59 | Admitting: Dermatology

## 2021-12-21 ENCOUNTER — Other Ambulatory Visit: Payer: Self-pay

## 2021-12-21 ENCOUNTER — Encounter: Payer: Self-pay | Admitting: Dermatology

## 2021-12-21 DIAGNOSIS — L84 Corns and callosities: Secondary | ICD-10-CM | POA: Diagnosis not present

## 2021-12-21 DIAGNOSIS — Z1283 Encounter for screening for malignant neoplasm of skin: Secondary | ICD-10-CM | POA: Diagnosis not present

## 2021-12-21 DIAGNOSIS — M21619 Bunion of unspecified foot: Secondary | ICD-10-CM

## 2021-12-21 DIAGNOSIS — D1801 Hemangioma of skin and subcutaneous tissue: Secondary | ICD-10-CM

## 2021-12-21 DIAGNOSIS — M21611 Bunion of right foot: Secondary | ICD-10-CM

## 2022-01-15 ENCOUNTER — Encounter: Payer: Self-pay | Admitting: Dermatology

## 2022-01-15 NOTE — Progress Notes (Signed)
? ?  New Patient ?  ?Subjective  ?Linda Willis is a 61 y.o. female who presents for the following: Annual Exam (Here for annual skin exam. ). ? ?General skin check, several areas of concern to patient ?Location:  ?Duration:  ?Quality:  ?Associated Signs/Symptoms: ?Modifying Factors:  ?Severity:  ?Timing: ?Context:  ? ? ?The following portions of the chart were reviewed this encounter and updated as appropriate:  Tobacco  Allergies  Meds  Problems  Med Hx  Surg Hx  Fam Hx   ?  ? ?Objective  ?Well appearing patient in no apparent distress; mood and affect are within normal limits. ?Full body exam: No atypical pigmented lesions or nonmelanoma skin cancer ? ?Mid Back ?Multiple 1 mm smooth red dermal papules ? ?Right Foot - Anterior ?Small generally asymptomatic bunion ? ?Left 2nd Metatarsal Plantar Area ?General asymptomatic clavus ? ? ? ?A full examination was performed including scalp, head, eyes, ears, nose, lips, neck, chest, axillae, abdomen, back, buttocks, bilateral upper extremities, bilateral lower extremities, hands, feet, fingers, toes, fingernails, and toenails. All findings within normal limits unless otherwise noted below.  Areas beneath undergarments not fully examined. ? ? ?Assessment & Plan  ?Encounter for screening for malignant neoplasm of skin ? ?Annual skin examination, encouraged to self examine twice annually.  Continue ultraviolet protection. ? ?Cherry angioma ?Mid Back ? ?No intervention necessary ? ?Bunion ?Right Foot - Anterior ? ?Discussed newer minimally invasive procedures ? ?Corns and callosities ?Left 2nd Metatarsal Plantar Area ? ?Discussed treatment options if it becomes painful. ? ? ?

## 2022-07-15 IMAGING — CT CT ABD-PEL WO/W CM
4 of 12 series · 13 of 46 positions shown, 17 images · IV contrast (omnipaque)
Comparison: None.

CLINICAL DATA: Asymptomatic microscopic hematuria.

EXAM:
CT ABDOMEN AND PELVIS WITHOUT AND WITH CONTRAST
TECHNIQUE: Multidetector CT imaging of the abdomen and pelvis was performed
following the standard protocol before and following the bolus
administration of intravenous contrast.
CONTRAST:  80mL OMNIPAQUE IOHEXOL 350 MG/ML SOLN

[Series 5: axial post · axial · 0.71mm/px · z∈[+1191,+1326]mm · 2 of 82 slices shown, 5 images]
[im 28/82  soft-tissue]
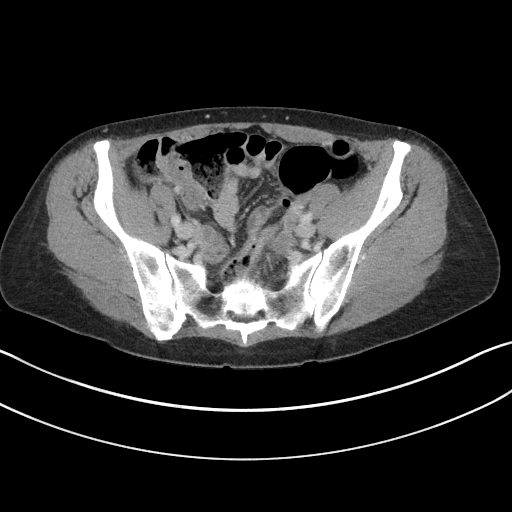
[im 28/82  lung]
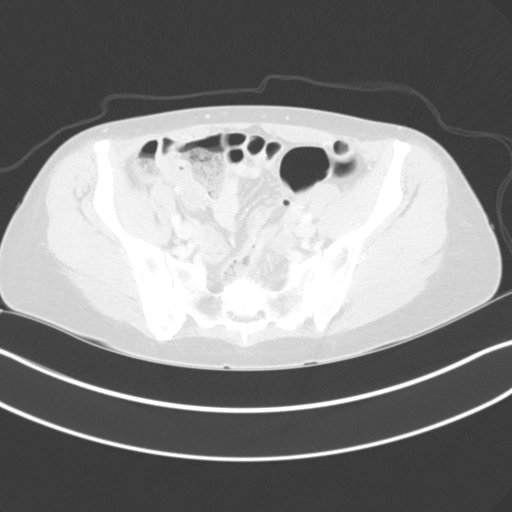
[im 28/82  bone]
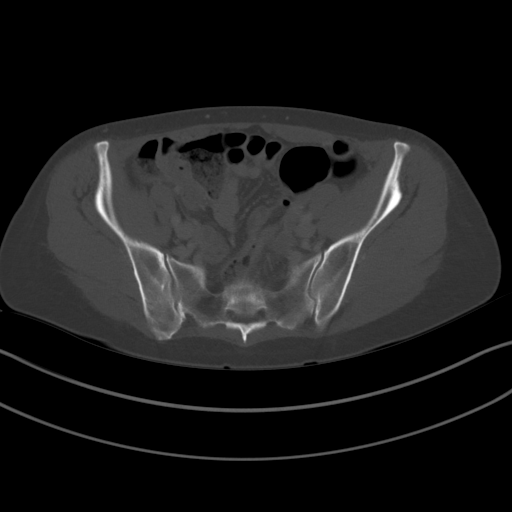
[im 55/82  soft-tissue]
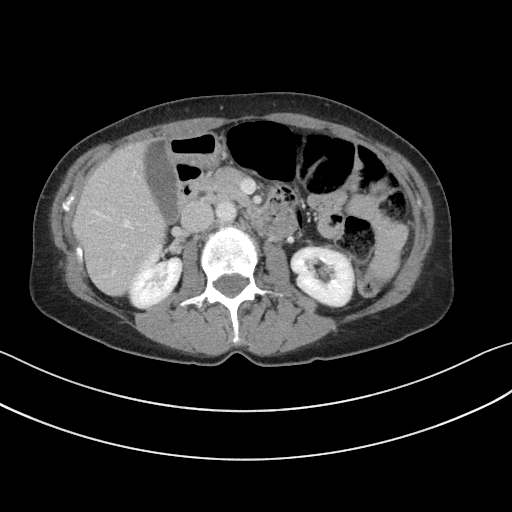
[im 55/82  lung]
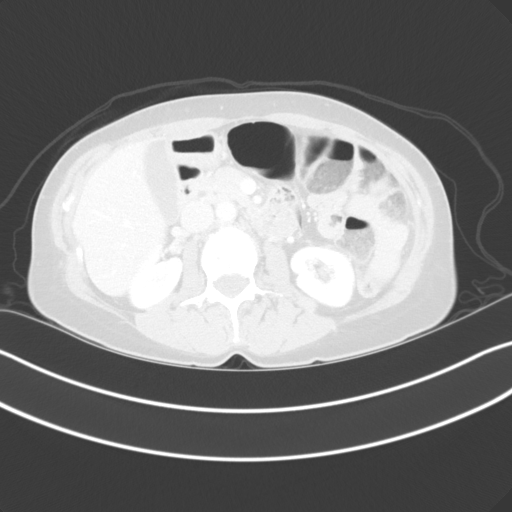

[Series 11: coronal post · coronal · 0.71mm/px · 2 of 100 slices shown, 3 images]
[im 34/100  soft-tissue]
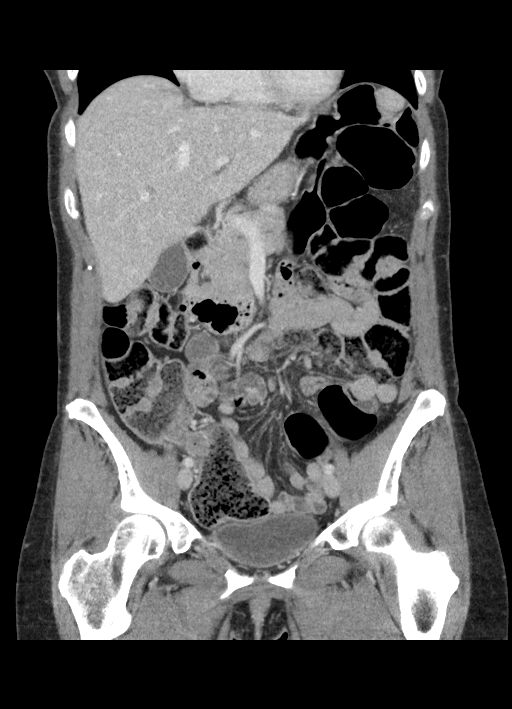
[im 34/100  bone]
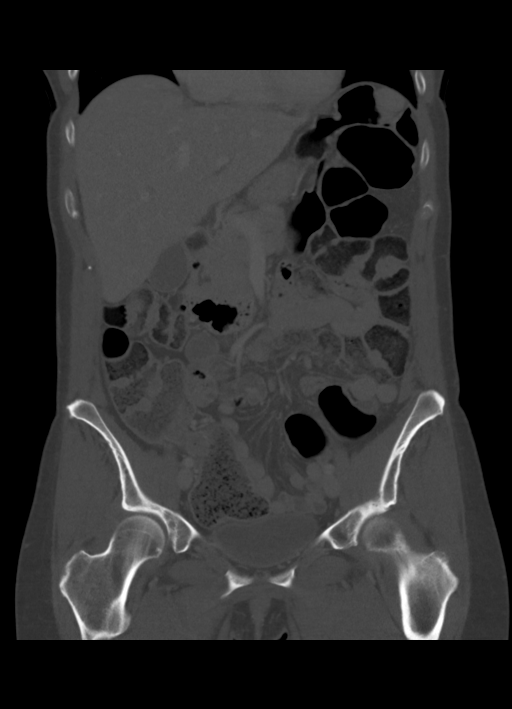
[im 67/100  soft-tissue]
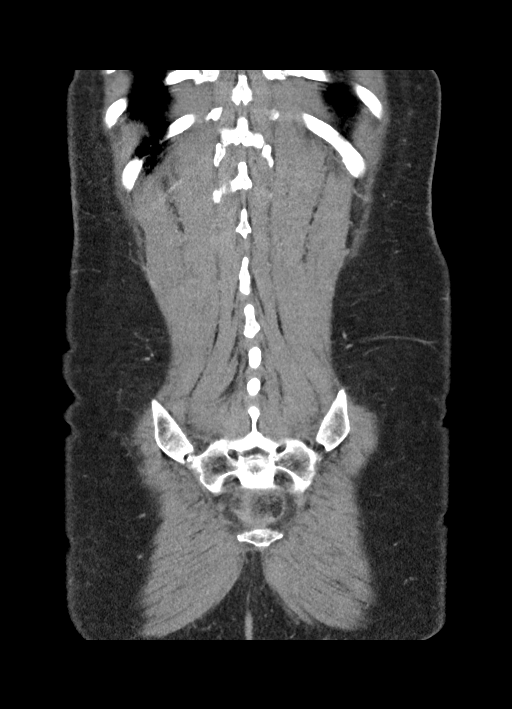

[Series 13: axial delay · axial · delayed · 0.67mm/px · z∈[+1164,+1304]mm · 2 of 85 slices shown]
[im 29/85  soft-tissue]
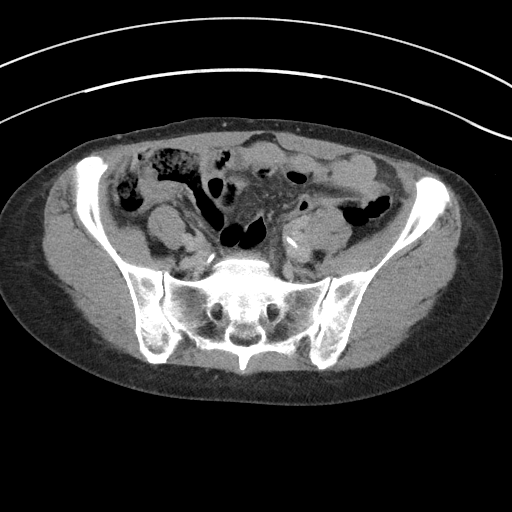
[im 57/85  soft-tissue]
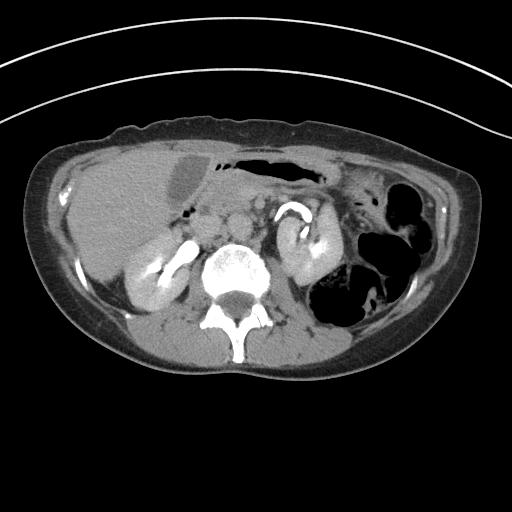

[Series 15: lung delay · axial · delayed · 0.67mm/px · z∈[+1074,+1390]mm · 7 of 212 slices shown]
[im 27/212  bone]
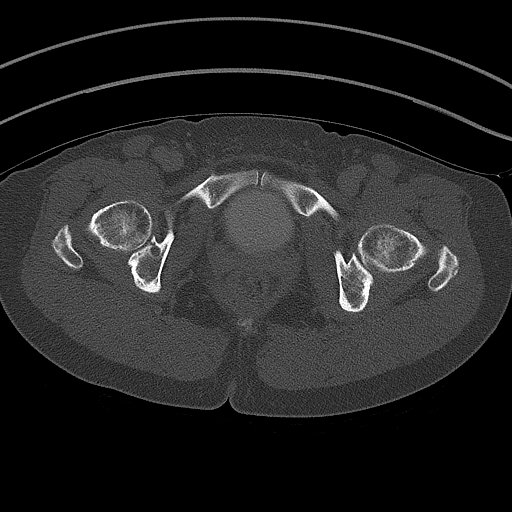
[im 53/212  bone]
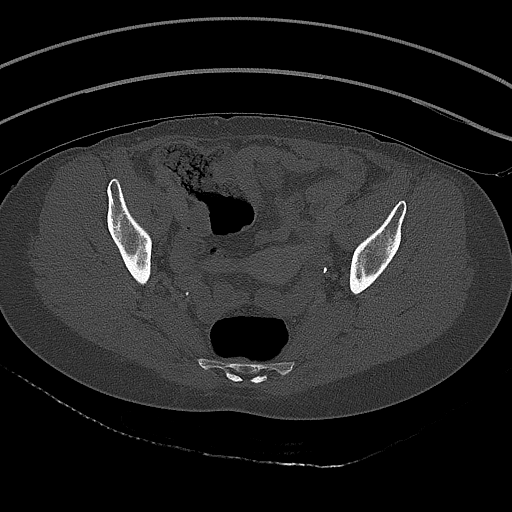
[im 80/212  bone]
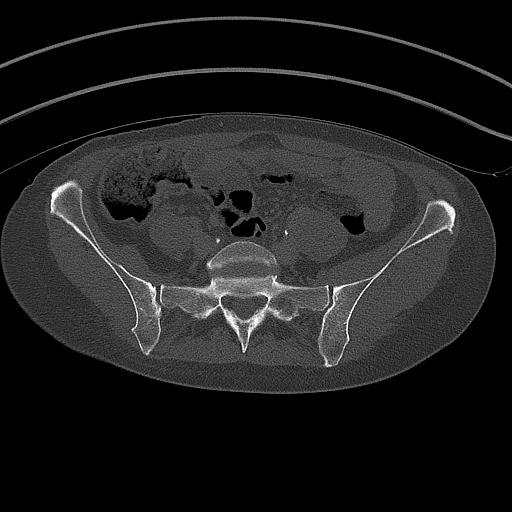
[im 106/212  bone]
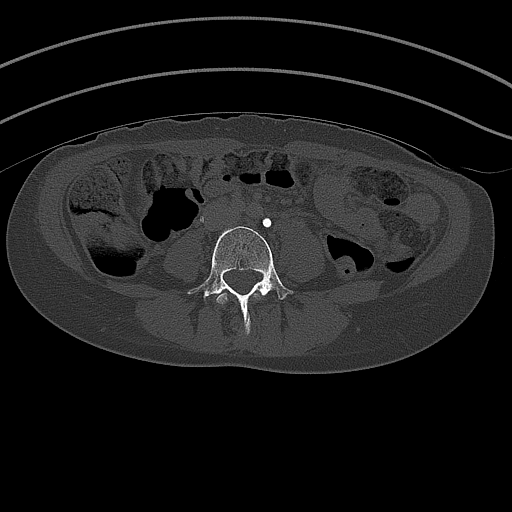
[im 132/212  bone]
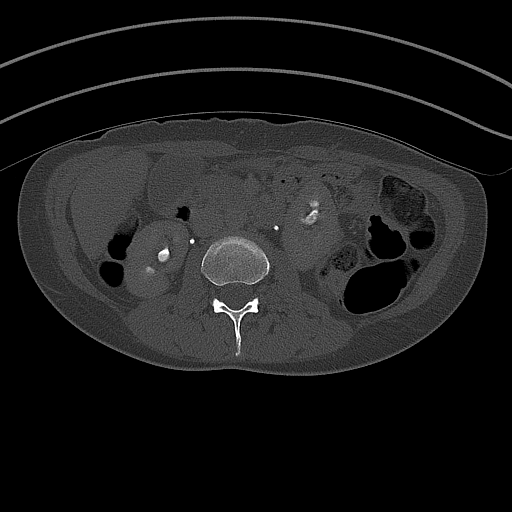
[im 159/212  bone]
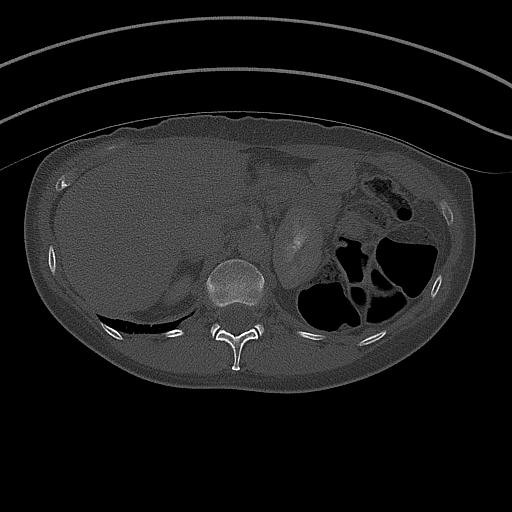
[im 185/212  bone]
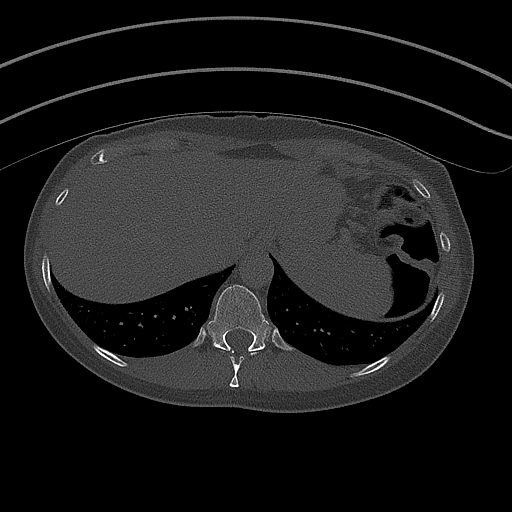

[13 of 46 positions shown; findings below may reference images not displayed]

FINDINGS: Lower chest: No acute abnormality.

Hepatobiliary: No suspicious hepatic lesion. Gallbladder is
unremarkable. No biliary ductal dilation.

Pancreas: No pancreatic ductal dilation or evidence of acute
inflammation.

Spleen: Within normal limits.

Adrenals/Urinary Tract: Bilateral adrenal glands are unremarkable.

No hydronephrosis. No renal, ureteral or bladder calculi visualized.
No solid enhancing renal mass. Tiny hypodense bilateral renal
lesions are technically too small to accurately characterize but
statistically likely to reflect renal cysts.

Kidneys demonstrate symmetric enhancement and excretion of contrast.
No suspicious filling defect visualized within the opacified
portions of the collecting systems or ureters on delayed imaging.

Urinary bladder is unremarkable without asymmetric wall thickening
or suspicious intraluminal filling defect visualized.

Stomach/Bowel: No enteric contrast was administered. Stomach is
predominantly decompressed limiting evaluation. Duodenal
diverticulum. No pathologic dilation of large or small bowel. The
appendix is not confidently identified however there is no pericecal
inflammation. Terminal ileum appears normal. No evidence of acute
bowel inflammation.

Vascular/Lymphatic: Aortic and branch vessel atherosclerosis without
abdominal aortic aneurysm. No pathologically enlarged abdominal or
pelvic lymph nodes.

Reproductive: Uterus and bilateral adnexa are unremarkable.

Other: No abdominopelvic ascites.

Musculoskeletal: Mild thoracolumbar spondylosis. No acute osseous
abnormality.
IMPRESSION: 1. No hydronephrosis. No renal, ureteral or bladder calculi
visualized.
2. No solid enhancing renal mass.
3.  Aortic Atherosclerosis ([id number]).

## 2022-12-27 ENCOUNTER — Ambulatory Visit: Payer: 59 | Admitting: Dermatology

## 2023-06-01 ENCOUNTER — Telehealth (HOSPITAL_COMMUNITY): Payer: Self-pay | Admitting: Licensed Clinical Social Worker

## 2023-06-01 NOTE — Telephone Encounter (Signed)
Linda Willis has not been engaged in therapy since 09/23/20.  Clinician Linda Willis by phone today at 1:40pm to inquire about whether she intends to make a followup appointment soon, and offer assistance with scheduling and referral resources if needed.  Linda Willis did not answer this phone call, so clinician left a voicemail informing her that she would be discharged 30 days from now unless she makes an effort to outreach either I or office staff within 30 day window.  Clinician provided contact numbers for our office in order to return call.     Linda Stain, LCSW, LCAS 06/01/23

## 2023-10-30 ENCOUNTER — Telehealth (HOSPITAL_COMMUNITY): Payer: Self-pay | Admitting: Licensed Clinical Social Worker

## 2023-10-30 ENCOUNTER — Encounter (HOSPITAL_COMMUNITY): Payer: Self-pay | Admitting: Licensed Clinical Social Worker

## 2023-10-30 NOTE — Telephone Encounter (Signed)
10/30/23 Discharge letter sent certified mail #[phone]/sh

## 2023-11-09 DIAGNOSIS — F330 Major depressive disorder, recurrent, mild: Secondary | ICD-10-CM | POA: Diagnosis not present

## 2023-11-16 DIAGNOSIS — M2550 Pain in unspecified joint: Secondary | ICD-10-CM | POA: Diagnosis not present

## 2023-11-16 DIAGNOSIS — E559 Vitamin D deficiency, unspecified: Secondary | ICD-10-CM | POA: Diagnosis not present

## 2023-11-16 DIAGNOSIS — R5383 Other fatigue: Secondary | ICD-10-CM | POA: Diagnosis not present

## 2023-11-16 DIAGNOSIS — G8929 Other chronic pain: Secondary | ICD-10-CM | POA: Diagnosis not present

## 2023-11-16 DIAGNOSIS — E781 Pure hyperglyceridemia: Secondary | ICD-10-CM | POA: Diagnosis not present

## 2023-11-16 DIAGNOSIS — R7301 Impaired fasting glucose: Secondary | ICD-10-CM | POA: Diagnosis not present

## 2023-11-16 DIAGNOSIS — M609 Myositis, unspecified: Secondary | ICD-10-CM | POA: Diagnosis not present

## 2023-11-16 DIAGNOSIS — F319 Bipolar disorder, unspecified: Secondary | ICD-10-CM | POA: Diagnosis not present

## 2023-11-16 DIAGNOSIS — M79643 Pain in unspecified hand: Secondary | ICD-10-CM | POA: Diagnosis not present

## 2023-11-16 DIAGNOSIS — D510 Vitamin B12 deficiency anemia due to intrinsic factor deficiency: Secondary | ICD-10-CM | POA: Diagnosis not present

## 2023-12-20 DIAGNOSIS — F330 Major depressive disorder, recurrent, mild: Secondary | ICD-10-CM | POA: Diagnosis not present

## 2024-01-24 DIAGNOSIS — F330 Major depressive disorder, recurrent, mild: Secondary | ICD-10-CM | POA: Diagnosis not present

## 2024-02-05 DIAGNOSIS — D225 Melanocytic nevi of trunk: Secondary | ICD-10-CM | POA: Diagnosis not present

## 2024-02-05 DIAGNOSIS — L814 Other melanin hyperpigmentation: Secondary | ICD-10-CM | POA: Diagnosis not present

## 2024-02-05 DIAGNOSIS — L821 Other seborrheic keratosis: Secondary | ICD-10-CM | POA: Diagnosis not present

## 2024-02-05 DIAGNOSIS — I788 Other diseases of capillaries: Secondary | ICD-10-CM | POA: Diagnosis not present

## 2024-03-01 DIAGNOSIS — F330 Major depressive disorder, recurrent, mild: Secondary | ICD-10-CM | POA: Diagnosis not present

## 2024-03-06 DIAGNOSIS — F330 Major depressive disorder, recurrent, mild: Secondary | ICD-10-CM | POA: Diagnosis not present

## 2024-03-13 DIAGNOSIS — Z13820 Encounter for screening for osteoporosis: Secondary | ICD-10-CM | POA: Diagnosis not present

## 2024-04-23 DIAGNOSIS — F3341 Major depressive disorder, recurrent, in partial remission: Secondary | ICD-10-CM | POA: Diagnosis not present

## 2024-05-29 DIAGNOSIS — F3341 Major depressive disorder, recurrent, in partial remission: Secondary | ICD-10-CM | POA: Diagnosis not present

## 2024-07-02 DIAGNOSIS — R197 Diarrhea, unspecified: Secondary | ICD-10-CM | POA: Diagnosis not present

## 2024-07-03 DIAGNOSIS — F3341 Major depressive disorder, recurrent, in partial remission: Secondary | ICD-10-CM | POA: Diagnosis not present

## 2024-08-09 DIAGNOSIS — F3341 Major depressive disorder, recurrent, in partial remission: Secondary | ICD-10-CM | POA: Diagnosis not present

## 2024-08-14 DIAGNOSIS — E7800 Pure hypercholesterolemia, unspecified: Secondary | ICD-10-CM | POA: Diagnosis not present

## 2024-08-14 DIAGNOSIS — I2510 Atherosclerotic heart disease of native coronary artery without angina pectoris: Secondary | ICD-10-CM | POA: Diagnosis not present

## 2024-08-14 DIAGNOSIS — Z8249 Family history of ischemic heart disease and other diseases of the circulatory system: Secondary | ICD-10-CM | POA: Diagnosis not present

## 2024-08-14 DIAGNOSIS — R931 Abnormal findings on diagnostic imaging of heart and coronary circulation: Secondary | ICD-10-CM | POA: Diagnosis not present

## 2024-08-26 DIAGNOSIS — G5602 Carpal tunnel syndrome, left upper limb: Secondary | ICD-10-CM | POA: Diagnosis not present

## 2024-08-26 DIAGNOSIS — M79641 Pain in right hand: Secondary | ICD-10-CM | POA: Diagnosis not present

## 2024-08-26 DIAGNOSIS — M25521 Pain in right elbow: Secondary | ICD-10-CM | POA: Diagnosis not present

## 2024-08-26 DIAGNOSIS — M19041 Primary osteoarthritis, right hand: Secondary | ICD-10-CM | POA: Diagnosis not present

## 2024-08-26 DIAGNOSIS — G5603 Carpal tunnel syndrome, bilateral upper limbs: Secondary | ICD-10-CM | POA: Diagnosis not present

## 2024-08-26 DIAGNOSIS — M79642 Pain in left hand: Secondary | ICD-10-CM | POA: Diagnosis not present

## 2024-08-26 DIAGNOSIS — M13841 Other specified arthritis, right hand: Secondary | ICD-10-CM | POA: Diagnosis not present

## 2024-08-26 DIAGNOSIS — G5601 Carpal tunnel syndrome, right upper limb: Secondary | ICD-10-CM | POA: Diagnosis not present

## 2024-09-03 DIAGNOSIS — F330 Major depressive disorder, recurrent, mild: Secondary | ICD-10-CM | POA: Diagnosis not present

## 2024-09-21 ENCOUNTER — Emergency Department (HOSPITAL_COMMUNITY)
Admission: EM | Admit: 2024-09-21 | Discharge: 2024-09-21 | Disposition: A | Discharge status: Home / Self Care | Attending: Emergency Medicine | Admitting: Emergency Medicine

## 2024-09-21 ENCOUNTER — Emergency Department (HOSPITAL_COMMUNITY)

## 2024-09-21 DIAGNOSIS — S8012XA Contusion of left lower leg, initial encounter: Secondary | ICD-10-CM | POA: Insufficient documentation

## 2024-09-21 DIAGNOSIS — S0081XA Abrasion of other part of head, initial encounter: Secondary | ICD-10-CM | POA: Insufficient documentation

## 2024-09-21 DIAGNOSIS — Z23 Encounter for immunization: Secondary | ICD-10-CM | POA: Insufficient documentation

## 2024-09-21 DIAGNOSIS — R519 Headache, unspecified: Secondary | ICD-10-CM | POA: Diagnosis present

## 2024-09-21 LAB — I-STAT CHEM 8, ED
BUN: 15 mg/dL (ref 8–23)
Calcium, Ion: 1.19 mmol/L (ref 1.15–1.40)
Chloride: 100 mmol/L (ref 98–111)
Creatinine, Ser: 1 mg/dL (ref 0.44–1.00)
Glucose, Bld: 94 mg/dL (ref 70–99)
HCT: 41 % (ref 36.0–46.0)
Hemoglobin: 13.9 g/dL (ref 12.0–15.0)
Potassium: 4.3 mmol/L (ref 3.5–5.1)
Sodium: 137 mmol/L (ref 135–145)
TCO2: 25 mmol/L (ref 22–32)

## 2024-09-21 LAB — CBC WITH DIFFERENTIAL/PLATELET
Abs Immature Granulocytes: 0.03 K/uL (ref 0.00–0.07)
Basophils Absolute: 0.1 K/uL (ref 0.0–0.1)
Basophils Relative: 1 %
Eosinophils Absolute: 0.1 K/uL (ref 0.0–0.5)
Eosinophils Relative: 2 %
HCT: 39.7 % (ref 36.0–46.0)
Hemoglobin: 13 g/dL (ref 12.0–15.0)
Immature Granulocytes: 0 %
Lymphocytes Relative: 17 %
Lymphs Abs: 1.5 K/uL (ref 0.7–4.0)
MCH: 29.5 pg (ref 26.0–34.0)
MCHC: 32.7 g/dL (ref 30.0–36.0)
MCV: 90.2 fL (ref 80.0–100.0)
Monocytes Absolute: 0.5 K/uL (ref 0.1–1.0)
Monocytes Relative: 6 %
Neutro Abs: 6.3 K/uL (ref 1.7–7.7)
Neutrophils Relative %: 74 %
Platelets: 264 K/uL (ref 150–400)
RBC: 4.4 MIL/uL (ref 3.87–5.11)
RDW: 12.9 % (ref 11.5–15.5)
WBC: 8.5 K/uL (ref 4.0–10.5)
nRBC: 0 % (ref 0.0–0.2)

## 2024-09-21 MED ORDER — TETANUS-DIPHTH-ACELL PERTUSSIS 5-2-15.5 LF-MCG/0.5 IM SUSP
0.5000 mL | Freq: Once | INTRAMUSCULAR | Status: AC
  Administered 2024-09-21: 0.5 mL via INTRAMUSCULAR
  Filled 2024-09-21: qty 0.5
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
## 2024-09-21 NOTE — ED Notes (Signed)
 Pt provided with discharge and follow up instructions, medications discussed, pt verbalized understanding. LDA removed, VSS, pt ambulatory out of ED w/ all paperwork and belongings in NAD.
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
## 2024-09-21 NOTE — Discharge Instructions (Signed)
 The x-rays fortunately did not show any signs of serious injury.  Take over-the-counter Tylenol or ibuprofen to help with aches and pains.  Follow-up with your doctor to be rechecked if your symptoms do not resolve over the next week
